# Patient Record
Sex: Female | Born: 1980 | Race: White | Hispanic: No | Marital: Single | Smoking: Current every day smoker
Health system: Southern US, Community
[De-identification: ages and names within clinical notes are randomized; demographics above are authoritative.]

## PROBLEM LIST (undated history)

## (undated) HISTORY — PX: APPENDECTOMY: SHX54

---
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
## 2003-05-19 ENCOUNTER — Emergency Department (HOSPITAL_COMMUNITY): Admission: EM | Admit: 2003-05-19 | Discharge: 2003-05-19 | Payer: Self-pay | Admitting: Emergency Medicine

## 2004-01-19 ENCOUNTER — Emergency Department (HOSPITAL_COMMUNITY): Admission: EM | Admit: 2004-01-19 | Discharge: 2004-01-19 | Payer: Self-pay | Admitting: Emergency Medicine

## 2004-10-01 ENCOUNTER — Emergency Department (HOSPITAL_COMMUNITY): Admission: EM | Admit: 2004-10-01 | Discharge: 2004-10-01 | Payer: Self-pay | Admitting: Emergency Medicine

## 2004-12-27 ENCOUNTER — Emergency Department (HOSPITAL_COMMUNITY): Admission: EM | Admit: 2004-12-27 | Discharge: 2004-12-27 | Payer: Self-pay | Admitting: Emergency Medicine

## 2005-01-22 ENCOUNTER — Emergency Department (HOSPITAL_COMMUNITY): Admission: EM | Admit: 2005-01-22 | Discharge: 2005-01-22 | Payer: Self-pay | Admitting: Emergency Medicine

## 2005-04-22 ENCOUNTER — Emergency Department (HOSPITAL_COMMUNITY): Admission: EM | Admit: 2005-04-22 | Discharge: 2005-04-22 | Payer: Self-pay | Admitting: Emergency Medicine

## 2005-11-13 ENCOUNTER — Emergency Department (HOSPITAL_COMMUNITY): Admission: EM | Admit: 2005-11-13 | Discharge: 2005-11-13 | Payer: Self-pay | Admitting: Emergency Medicine

## 2007-09-23 ENCOUNTER — Encounter (INDEPENDENT_AMBULATORY_CARE_PROVIDER_SITE_OTHER): Payer: Self-pay | Admitting: Unknown Physician Specialty

## 2007-09-23 ENCOUNTER — Other Ambulatory Visit: Admission: RE | Admit: 2007-09-23 | Discharge: 2007-09-23 | Payer: Self-pay | Admitting: Unknown Physician Specialty

## 2007-11-15 ENCOUNTER — Emergency Department (HOSPITAL_COMMUNITY): Admission: EM | Admit: 2007-11-15 | Discharge: 2007-11-15 | Payer: Self-pay | Admitting: Emergency Medicine

## 2007-12-08 ENCOUNTER — Emergency Department (HOSPITAL_COMMUNITY): Admission: EM | Admit: 2007-12-08 | Discharge: 2007-12-08 | Payer: Self-pay | Admitting: Emergency Medicine

## 2008-06-08 ENCOUNTER — Emergency Department (HOSPITAL_COMMUNITY): Admission: EM | Admit: 2008-06-08 | Discharge: 2008-06-08 | Payer: Self-pay | Admitting: Emergency Medicine

## 2009-04-20 ENCOUNTER — Emergency Department (HOSPITAL_COMMUNITY): Admission: EM | Admit: 2009-04-20 | Discharge: 2009-04-20 | Payer: Self-pay | Admitting: Emergency Medicine

## 2009-06-09 ENCOUNTER — Emergency Department (HOSPITAL_COMMUNITY): Admission: EM | Admit: 2009-06-09 | Discharge: 2009-06-09 | Payer: Self-pay | Admitting: Emergency Medicine

## 2009-10-16 ENCOUNTER — Emergency Department (HOSPITAL_COMMUNITY): Admission: EM | Admit: 2009-10-16 | Discharge: 2009-10-16 | Payer: Self-pay | Admitting: Emergency Medicine

## 2010-02-28 ENCOUNTER — Emergency Department (HOSPITAL_COMMUNITY): Admission: EM | Admit: 2010-02-28 | Discharge: 2010-02-28 | Payer: Self-pay | Admitting: Emergency Medicine

## 2010-03-27 ENCOUNTER — Emergency Department (HOSPITAL_COMMUNITY): Admission: EM | Admit: 2010-03-27 | Discharge: 2010-03-27 | Payer: Self-pay | Admitting: Emergency Medicine

## 2010-06-15 ENCOUNTER — Emergency Department (HOSPITAL_COMMUNITY): Admission: EM | Admit: 2010-06-15 | Discharge: 2010-06-15 | Payer: Self-pay | Admitting: Emergency Medicine

## 2010-11-05 LAB — CBC
MCH: 31.7 pg (ref 26.0–34.0)
WBC: 8.7 10*3/uL (ref 4.0–10.5)

## 2010-11-05 LAB — HEPATIC FUNCTION PANEL: Total Protein: 7.1 g/dL (ref 6.0–8.3)

## 2010-11-05 LAB — DIFFERENTIAL
Basophils Absolute: 0.1 10*3/uL (ref 0.0–0.1)
Basophils Relative: 1 % (ref 0–1)
Eosinophils Absolute: 0.1 10*3/uL (ref 0.0–0.7)
Monocytes Relative: 9 % (ref 3–12)
Neutro Abs: 4.9 10*3/uL (ref 1.7–7.7)

## 2010-11-24 LAB — URINALYSIS, ROUTINE W REFLEX MICROSCOPIC
Hgb urine dipstick: NEGATIVE
Nitrite: NEGATIVE

## 2011-03-27 ENCOUNTER — Emergency Department (HOSPITAL_COMMUNITY)
Admission: EM | Admit: 2011-03-27 | Discharge: 2011-03-28 | Disposition: A | Discharge status: Home / Self Care | Payer: Self-pay | Attending: Emergency Medicine | Admitting: Emergency Medicine

## 2011-03-27 ENCOUNTER — Encounter: Payer: Self-pay | Admitting: *Deleted

## 2011-03-27 DIAGNOSIS — N898 Other specified noninflammatory disorders of vagina: Secondary | ICD-10-CM | POA: Insufficient documentation

## 2011-03-27 DIAGNOSIS — N939 Abnormal uterine and vaginal bleeding, unspecified: Secondary | ICD-10-CM

## 2011-03-27 DIAGNOSIS — F319 Bipolar disorder, unspecified: Secondary | ICD-10-CM | POA: Insufficient documentation

## 2011-03-27 DIAGNOSIS — F17200 Nicotine dependence, unspecified, uncomplicated: Secondary | ICD-10-CM | POA: Insufficient documentation

## 2011-03-27 DIAGNOSIS — N946 Dysmenorrhea, unspecified: Secondary | ICD-10-CM | POA: Insufficient documentation

## 2011-03-27 LAB — PREGNANCY, URINE: Preg Test, Ur: NEGATIVE

## 2011-03-27 LAB — POCT PREGNANCY, URINE: Preg Test, Ur: NEGATIVE

## 2011-03-27 MED ORDER — ACETAMINOPHEN 325 MG PO TABS
650.0000 mg | ORAL_TABLET | Freq: Once | ORAL | Status: AC
  Administered 2011-03-27: 650 mg via ORAL
  Filled 2011-03-27: qty 2

## 2011-03-27 NOTE — ED Notes (Signed)
Patient with sudden vaginal bleeding, patient unsure if pregnant but admits to being a month and a half late, vaginal bleeding has slowed down a lot since but still spotting; black blood clots noted and bright red in color, c/o lower abd pain

## 2011-03-27 NOTE — ED Provider Notes (Signed)
History     CSN: [id number] Arrival date & time: 03/27/2011 10:53 PM  Chief Complaint  Patient presents with  . Vaginal Bleeding   HPI Comments: Patient is a 30 year old female who has no significant past medical history other than bipolar disorder who presents with acute onset vaginal bleeding this evening. She states that initially it was painless but then developed diffuse abdominal cramping within 30 minutes. The cramping is intermittent, gradually improving, not associated with fever, chills, nausea, vomiting. She states that she is trying to get pregnant at this time and is having sexual intercourse without protection to this end. She has never been pregnant in the past. The vaginal bleeding was bright red mixed with dark red blood. She denies urinary symptoms, bowel habit change, swelling, rash, itching  Patient is a 30 y.o. female presenting with vaginal bleeding. The history is provided by the patient.  Vaginal Bleeding    History reviewed. No pertinent past medical history.  Past Surgical History  Procedure Date  . Appendectomy     History reviewed. No pertinent family history.  History  Substance Use Topics  . Smoking status: Current Everyday Smoker -- 1.0 packs/day    Types: Cigarettes  . Smokeless tobacco: Not on file  . Alcohol Use: No    OB History    Grav Para Term Preterm Abortions TAB SAB Ect Mult Living                  Review of Systems  Genitourinary: Positive for vaginal bleeding.  All other systems reviewed and are negative.    Physical Exam  BP 113/69  Pulse 92  Temp(Src) 98.2 F (36.8 C) (Oral)  Resp 18  Ht 5\' 6"  (1.676 m)  Wt 110 lb (49.896 kg)  BMI 17.75 kg/m2  SpO2 100%  LMP 01/31/2011  Physical Exam  Nursing note and vitals reviewed. Constitutional: She appears well-developed and well-nourished. No distress.  HENT:  Head: Normocephalic and atraumatic.  Mouth/Throat: Oropharynx is clear and moist. No oropharyngeal exudate.    Eyes: Conjunctivae and EOM are normal. Pupils are equal, round, and reactive to light. Right eye exhibits no discharge. Left eye exhibits no discharge. No scleral icterus.  Neck: Normal range of motion. Neck supple. No JVD present. No thyromegaly present.  Cardiovascular: Normal rate, regular rhythm, normal heart sounds and intact distal pulses.  Exam reveals no gallop and no friction rub.   No murmur heard. Pulmonary/Chest: Effort normal and breath sounds normal. No respiratory distress. She has no wheezes. She has no rales.  Abdominal: Soft. Bowel sounds are normal. She exhibits no distension and no mass. There is tenderness (Mild diffuse tenderness to palpation, no focal tenderness, normal bowel sounds, non-peritoneal, no guarding. No CVA tenderness.).  Genitourinary:       Chaperone present during exam, vaginal bleeding, no cervical motion tenderness, adnexal tenderness or masses.  Musculoskeletal: Normal range of motion. She exhibits no edema and no tenderness.  Lymphadenopathy:    She has no cervical adenopathy.  Neurological: She is alert. Coordination normal.  Skin: Skin is warm and dry. No rash noted. No erythema.  Psychiatric: She has a normal mood and affect. Her behavior is normal.    ED Course  Procedures  MDM Evaluate for pregnancy, possible miscarriage. Has normal blood pressure, pulse, respirations, oxygen saturations. She is afebrile.  Pelvic exam benign, urinalysis negative, urine pregnancy negative. Likely having menstrual cramps and pains. NSAIDs provided, reassurance given.  Vida Roller, MD 03/28/11 [phone]

## 2011-03-28 LAB — URINE MICROSCOPIC-ADD ON

## 2011-03-28 LAB — URINALYSIS, ROUTINE W REFLEX MICROSCOPIC
Glucose, UA: NEGATIVE mg/dL
Ketones, ur: NEGATIVE mg/dL
Leukocytes, UA: NEGATIVE
Nitrite: NEGATIVE
Specific Gravity, Urine: >1.03 — ABNORMAL HIGH (ref 1.005–1.030)
pH: 6 (ref 5.0–8.0)

## 2011-03-28 LAB — WET PREP, GENITAL: Yeast Wet Prep HPF POC: NONE SEEN

## 2011-03-28 MED ORDER — IBUPROFEN 800 MG PO TABS: 800.0000 mg | ORAL_TABLET | Freq: Once | ORAL | Status: DC

## 2011-03-28 MED ORDER — IBUPROFEN 800 MG PO TABS: 800.0000 mg | ORAL_TABLET | Freq: Three times a day (TID) | ORAL | Status: AC

## 2011-03-29 LAB — GC/CHLAMYDIA PROBE AMP, GENITAL: Chlamydia, DNA Probe: NEGATIVE

## 2011-05-14 LAB — URINE CULTURE

## 2011-05-14 LAB — URINALYSIS, ROUTINE W REFLEX MICROSCOPIC
Glucose, UA: NEGATIVE
Hgb urine dipstick: NEGATIVE

## 2011-05-14 LAB — PREGNANCY, URINE: Preg Test, Ur: NEGATIVE

## 2017-01-09 HISTORY — PX: KNEE SURGERY: SHX244

## 2019-11-10 ENCOUNTER — Encounter: Payer: Self-pay | Admitting: Cardiovascular Disease

## 2019-11-11 ENCOUNTER — Emergency Department (HOSPITAL_COMMUNITY)
Admission: EM | Admit: 2019-11-11 | Discharge: 2019-11-11 | Disposition: A | Discharge status: Home / Self Care | Payer: Medicaid Other

## 2019-11-11 ENCOUNTER — Other Ambulatory Visit: Payer: Self-pay

## 2019-11-12 ENCOUNTER — Emergency Department (HOSPITAL_COMMUNITY)
Admission: EM | Admit: 2019-11-12 | Discharge: 2019-11-12 | Disposition: A | Discharge status: Home / Self Care | Payer: Medicaid Other | Attending: Emergency Medicine | Admitting: Emergency Medicine

## 2019-11-12 ENCOUNTER — Encounter (HOSPITAL_COMMUNITY): Payer: Self-pay

## 2019-11-12 ENCOUNTER — Emergency Department (HOSPITAL_COMMUNITY): Payer: Medicaid Other

## 2019-11-12 DIAGNOSIS — I951 Orthostatic hypotension: Secondary | ICD-10-CM | POA: Diagnosis not present

## 2019-11-12 DIAGNOSIS — F17210 Nicotine dependence, cigarettes, uncomplicated: Secondary | ICD-10-CM | POA: Diagnosis not present

## 2019-11-12 DIAGNOSIS — Z79899 Other long term (current) drug therapy: Secondary | ICD-10-CM | POA: Insufficient documentation

## 2019-11-12 LAB — CBC WITH DIFFERENTIAL/PLATELET
Abs Immature Granulocytes: 0.02 10*3/uL (ref 0.00–0.07)
Basophils Absolute: 0 10*3/uL (ref 0.0–0.1)
Basophils Relative: 0 %
Eosinophils Absolute: 0 10*3/uL (ref 0.0–0.5)
Eosinophils Relative: 0 %
HCT: 41.9 % (ref 36.0–46.0)
Hemoglobin: 13.5 g/dL (ref 12.0–15.0)
Immature Granulocytes: 0 %
Lymphocytes Relative: 24 %
Lymphs Abs: 2.2 10*3/uL (ref 0.7–4.0)
MCH: 30.6 pg (ref 26.0–34.0)
MCHC: 32.2 g/dL (ref 30.0–36.0)
MCV: 95 fL (ref 80.0–100.0)
Monocytes Absolute: 0.4 10*3/uL (ref 0.1–1.0)
Monocytes Relative: 4 %
Neutro Abs: 6.4 10*3/uL (ref 1.7–7.7)
Neutrophils Relative %: 72 %
Platelets: 281 10*3/uL (ref 150–400)
RBC: 4.41 MIL/uL (ref 3.87–5.11)
RDW: 12.4 % (ref 11.5–15.5)
WBC: 9 10*3/uL (ref 4.0–10.5)
nRBC: 0 % (ref 0.0–0.2)

## 2019-11-12 LAB — BASIC METABOLIC PANEL
Anion gap: 8 (ref 5–15)
BUN: 9 mg/dL (ref 6–20)
CO2: 24 mmol/L (ref 22–32)
Calcium: 8.8 mg/dL — ABNORMAL LOW (ref 8.9–10.3)
Chloride: 108 mmol/L (ref 98–111)
Creatinine, Ser: 0.65 mg/dL (ref 0.44–1.00)
GFR calc Af Amer: >60 mL/min (ref >60)
GFR calc non Af Amer: >60 mL/min (ref >60)
Glucose, Bld: 85 mg/dL (ref 70–99)
Potassium: 4.1 mmol/L (ref 3.5–5.1)
Sodium: 140 mmol/L (ref 135–145)

## 2019-11-12 LAB — I-STAT BETA HCG BLOOD, ED (MC, WL, AP ONLY): I-stat hCG, quantitative: <5 m[IU]/mL (ref <5)

## 2019-11-12 MED ORDER — SODIUM CHLORIDE 0.9 % IV BOLUS
1000.0000 mL | Freq: Once | INTRAVENOUS | Status: AC
  Administered 2019-11-12: 1000 mL via INTRAVENOUS

## 2019-11-12 NOTE — Discharge Instructions (Signed)
Please make sure you are staying well-hydrated at home.  You should keep your appointment with cardiology tomorrow.  Return to the emergency department for new or worsening symptoms.

## 2019-11-12 NOTE — ED Notes (Signed)
Patient transported to x-ray. ?

## 2019-11-12 NOTE — ED Provider Notes (Signed)
[facility] EMERGENCY DEPARTMENT Provider Note   CSN: [id number] Arrival date & time: 11/12/19  6962     History Chief Complaint  Patient presents with  . Blood Pressure Issues    Michele Church is a 39 y.o. female.  HPI   Patient is a 39 year old female who presents the emergency department today complaining of issues with her blood pressure.  States she has had orthostatic hypotension for about the last week.  States her blood pressure drops when she stands up and she feels lightheaded.  She has some associated shortness of breath has been ongoing for the same time period.  She denies any associated chest pain or palpitations.  She denies any bilateral lower extremity pain or swelling.  She denies any estrogen therapy.  No recent admissions or surgeries.  No recent trauma.  No history of VTE or cancer.  She denies excessive caffeine intake.  She denies any illicit drug use or over-the-counter supplements.  She has not had any recent medication changes.  She has followed up about this at an outside facility and states she had labs drawn 2 days ago that she states were normal.  She was referred to cardiology and has an appointment tomorrow.  She decided to come to the ED today because she was so symptomatic from her blood pressure.  She is on buspar, celexa and trazodone.  History reviewed. No pertinent past medical history.  There are no problems to display for this patient.   Past Surgical History:  Procedure Laterality Date  . APPENDECTOMY       OB History   No obstetric history on file.     No family history on file.  Social History   Tobacco Use  . Smoking status: Current Every Day Smoker    Packs/day: 0.50    Types: Cigarettes  . Smokeless tobacco: Never Used  Substance Use Topics  . Alcohol use: No  . Drug use: No    Home Medications Prior to Admission medications   Medication Sig Start Date End Date Taking? Authorizing Provider  celecoxib (CELEBREX) 100 MG  capsule Take 100 mg by mouth daily.      [provider]  lamoTRIgine (LAMICTAL) 100 MG tablet Take 100 mg by mouth daily.      [provider]  methylphenidate (RITALIN) 10 MG tablet Take 10 mg by mouth 2 (two) times daily.      [provider]  mirtazapine (REMERON) 15 MG tablet Take 15 mg by mouth at bedtime.      [provider]    Allergies    Patient has no known allergies.  Review of Systems   Review of Systems  Constitutional: Negative for chills and fever.  HENT: Negative for ear pain and sore throat.   Eyes: Negative for visual disturbance.  Respiratory: Positive for shortness of breath. Negative for cough.   Cardiovascular: Negative for chest pain and leg swelling.  Gastrointestinal: Negative for abdominal pain, constipation, diarrhea, nausea and vomiting.  Genitourinary: Negative for dysuria and hematuria.  Musculoskeletal: Negative for back pain.  Skin: Negative for rash.  Neurological: Positive for light-headedness.  All other systems reviewed and are negative.   Physical Exam Updated Vital Signs BP 129/79   Pulse 73   Temp 98.4 F (36.9 C) (Oral)   Resp 20   Ht 5\' 7"  (1.702 m)   Wt 45.4 kg   LMP 11/07/2019   SpO2 100%   BMI 15.66 kg/m   Physical  Exam Vitals and nursing note reviewed.  Constitutional:      General: She is not in acute distress.    Appearance: She is well-developed. She is not ill-appearing or toxic-appearing.  HENT:     Head: Normocephalic and atraumatic.  Eyes:     Conjunctiva/sclera: Conjunctivae normal.  Cardiovascular:     Rate and Rhythm: Normal rate and regular rhythm.     Pulses: Normal pulses.     Heart sounds: Normal heart sounds. No murmur.  Pulmonary:     Effort: Pulmonary effort is normal. No respiratory distress.     Breath sounds: Normal breath sounds. No wheezing, rhonchi or rales.  Abdominal:     General: Bowel sounds are normal.     Palpations: Abdomen is soft.     Tenderness:  There is no abdominal tenderness. There is no guarding or rebound.  Musculoskeletal:     Cervical back: Neck supple.  Skin:    General: Skin is warm and dry.  Neurological:     Mental Status: She is alert.     ED Results / Procedures / Treatments   Labs (all labs ordered are listed, but only abnormal results are displayed) Labs Reviewed  BASIC METABOLIC PANEL - Abnormal; Notable for the following components:      Result Value   Calcium 8.8 (*)    All other components within normal limits  CBC WITH DIFFERENTIAL/PLATELET  I-STAT BETA HCG BLOOD, ED ([facility], [facility], [facility] ONLY)  I-STAT BETA HCG BLOOD, ED ([facility], [facility], [facility] ONLY)    EKG EKG Interpretation  Date/Time:  Thursday November 12 2019 08:37:56 EDT Ventricular Rate:  74 PR Interval:    QRS Duration: 72 QT Interval:  398 QTC Calculation: 442 R Axis:   76 Text Interpretation: Sinus rhythm RSR' in V1 or V2, probably normal variant Confirmed by Elnora Morrison [phone]) on 11/12/2019 9:14:17 AM   Radiology DG Chest Portable 1 View  Result Date: 11/12/2019 CLINICAL DATA:  Shortness of breath EXAM: PORTABLE CHEST 1 VIEW COMPARISON:  11/02/2019 FINDINGS: Cardiomediastinal contours and hilar structures are unremarkable. Lungs are clear. No acute bone process. IMPRESSION: No acute cardiopulmonary disease. Electronically Signed   By: Zetta Bills M.D.   On: 11/12/2019 09:09    Procedures Procedures (including critical care time)  9:05 AM Cardiac monitoring reveals NSR, HR 70s (Rate & rhythm), as reviewed and interpreted by me. Cardiac monitoring was ordered due to orthostatic hypotension, sob, and to monitor patient for dysrhythmia.   Medications Ordered in ED Medications  sodium chloride 0.9 % bolus 1,000 mL (0 mLs Intravenous Stopped 11/12/19 1045)    ED Course  I have reviewed the triage vital signs and the nursing notes.  Pertinent labs & imaging results that were available during my care of the patient were reviewed by me and considered  in my medical decision making (see chart for details).    MDM Rules/Calculators/A&P                      Patient is a 39 year old female presenting for evaluation of orthostatic hypotension.  Has been ongoing intermittently for the last week.  Has had work-up at outside facility showing normal labs.  Has been referred to cardiology and has follow-up planned for tomorrow.  In the ED, patient is mildly orthostatic.  Her vital signs are otherwise reassuring.  Reviewed/interpreted labs CBC w/o anemia BMP without gross electrolyte derangement.  Normal kidney function. Pregnancy test neg  EKG with NSR, RSR' in  V1 or V2, probably normal variant  Chest x-ray personally reviewed/interpreted -no pneumonia, pneumothorax or other acute abnormality.  Reviewed pts meds per care everywhere. She is on buspar, celexa and trazodone. Celexa can cause orthostatic hypotension. Trazodone can also cause hypotension.  This may need to be considered when she follows up with cardiology.  Reassessed patient.  She is feeling improved after IV fluids.  Discussed results of the lab work, EKG and chest x-ray and discussed plan for follow-up with cardiology.  I did not find any emergent etiology of her symptoms that would require further work-up or admission to the hospital at this time.  Dorene Sorrow has appropriate follow-up scheduled.  Advised on specific return precautions.  She voiced understanding plan reasons to return.  Questions answered.  Patient stable for discharge.  Final Clinical Impression(s) / ED Diagnoses Final diagnoses:  Orthostatic hypotension    Rx / DC Orders ED Discharge Orders    None       Rayne Du 11/12/19 1150    Blane Ohara, MD 11/14/19 [phone]

## 2019-11-12 NOTE — ED Triage Notes (Signed)
BP 121/82 lying HR 69  BP 126/83 sitting HR 71 BP 111/85 standing HR 99  Pt has been having orthostatic hypotension issues. Has seen [facility] for this as well as PCP. Pt has an appointment to follow up with cardiology.

## 2019-11-30 ENCOUNTER — Encounter: Payer: Self-pay | Admitting: *Deleted

## 2019-12-01 ENCOUNTER — Ambulatory Visit: Payer: Medicaid Other | Admitting: Cardiovascular Disease

## 2019-12-02 ENCOUNTER — Encounter: Payer: Self-pay | Admitting: Cardiovascular Disease

## 2019-12-14 ENCOUNTER — Encounter: Payer: Self-pay | Admitting: *Deleted

## 2019-12-15 ENCOUNTER — Other Ambulatory Visit: Payer: Self-pay

## 2019-12-15 ENCOUNTER — Ambulatory Visit (INDEPENDENT_AMBULATORY_CARE_PROVIDER_SITE_OTHER): Payer: Medicaid Other | Admitting: Cardiovascular Disease

## 2019-12-15 ENCOUNTER — Encounter: Payer: Self-pay | Admitting: Cardiovascular Disease

## 2019-12-15 VITALS — BP 110/70 | HR 95 | Ht 67.0 in | Wt 92.0 lb

## 2019-12-15 DIAGNOSIS — I951 Orthostatic hypotension: Secondary | ICD-10-CM

## 2019-12-15 NOTE — Progress Notes (Signed)
CARDIOLOGY CONSULT NOTE  Patient ID: Michele Church MRN: [id number] DOB/AGE: 1981/02/11 39 y.o.  Admit date: (Not on file) Primary Physician: [facility]    Reason for Consultation: Orthostatic hypotension  HPI: Michele Church is a 39 y.o. female who is being seen today for the evaluation of orthostatic hypotension at the request of Allwardt, Alyssa, PA.   She was evaluated in the ED most recently on 11/12/2019 for orthostatic hypotension.  She denied associated chest pain or palpitations.  She had no bilateral leg swelling.  She smokes about 1/2 pack of cigarettes daily.  BP was 09/17/1977 at rest with a heart rate of 73 bpm.  She was given IV fluids and symptoms improved.  Basic metabolic panel and CBC were unremarkable.  Chest x-ray showed no acute cardiopulmonary disease.  I personally reviewed the ECG which demonstrated sinus rhythm with RSR prime pattern.  She told me she has been experiencing orthostatic dizziness for the past 2 months.  She denies syncope.  She denies exertional chest pain or shortness of breath.  She has bilateral knee problems and previously had a procedure on the right knee and may need a procedure on the left knee.  She denies leg swelling but has had some left knee swelling.  She drinks 2-3 bottles of soda a day and possibly 2-3 bottles of water daily.  She drinks Pedialyte on occasion and a protein shake on occasion.  She said she has not been eating well due to increased levels of stress at home.  She has a 33-year-old daughter who weighs 100 pounds and is projected to be 6 foot 4.   No Known Allergies  Current Outpatient Medications  Medication Sig Dispense Refill  . celecoxib (CELEBREX) 100 MG capsule Take 100 mg by mouth daily.      . citalopram (CELEXA) 10 MG tablet Take 10 mg by mouth daily.    . traZODone (DESYREL) 100 MG tablet Take 100 mg by mouth at bedtime.     No current facility-administered medications for this  visit.    History reviewed. No pertinent past medical history.  Past Surgical History:  Procedure Laterality Date  . APPENDECTOMY    . KNEE SURGERY Right 01/09/2017   right knee release of knee cap    Social History   Socioeconomic History  . Marital status: Single    Spouse name: Not on file  . Number of children: Not on file  . Years of education: Not on file  . Highest education level: Not on file  Occupational History  . Not on file  Tobacco Use  . Smoking status: Current Every Day Smoker    Packs/day: 0.50    Types: Cigarettes  . Smokeless tobacco: Never Used  Substance and Sexual Activity  . Alcohol use: No  . Drug use: No  . Sexual activity: Yes  Other Topics Concern  . Not on file  Social History Narrative  . Not on file   Social Determinants of Health   Financial Resource Strain:   . Difficulty of Paying Living Expenses:   Food Insecurity:   . Worried About Programme researcher, broadcasting/film/video in the Last Year:   . Barista in the Last Year:   Transportation Needs:   . Freight forwarder (Medical):   Marland Kitchen Lack of Transportation (Non-Medical):   Physical Activity:   . Days of Exercise per Week:   . Minutes of Exercise per Session:  Stress:   . Feeling of Stress :   Social Connections:   . Frequency of Communication with Friends and Family:   . Frequency of Social Gatherings with Friends and Family:   . Attends Religious Services:   . Active Member of Clubs or Organizations:   . Attends Archivist Meetings:   Marland Kitchen Marital Status:   Intimate Partner Violence:   . Fear of Current or Ex-Partner:   . Emotionally Abused:   Marland Kitchen Physically Abused:   . Sexually Abused:      No family history of premature CAD in 1st degree relatives.  Current Meds  Medication Sig  . celecoxib (CELEBREX) 100 MG capsule Take 100 mg by mouth daily.    . citalopram (CELEXA) 10 MG tablet Take 10 mg by mouth daily.  . traZODone (DESYREL) 100 MG tablet Take 100 mg by mouth  at bedtime.  . [DISCONTINUED] lamoTRIgine (LAMICTAL) 100 MG tablet Take 100 mg by mouth daily.        Review of systems complete and found to be negative unless listed above in HPI   [facility], LPN was present throughout the entirety of the encounter.  Physical exam Blood pressure 110/70, pulse 95, height 5\' 7"  (1.702 m), weight 92 lb (41.7 kg). General: NAD Neck: No JVD, no thyromegaly or thyroid nodule.  Lungs: Clear to auscultation bilaterally with normal respiratory effort. CV: Nondisplaced PMI. Regular rate and rhythm, normal S1/S2, no S3/S4, no murmur.  No peripheral edema.  No carotid bruit.    Abdomen: Soft, nontender, no distention.  Skin: Intact without lesions or rashes.  Neurologic: Alert and oriented x 3.  Psych: Normal affect. Extremities: No clubbing or cyanosis.  HEENT: Normal.   ECG: Most recent ECG reviewed.   Labs: Lab Results  Component Value Date/Time   K 4.1 11/12/2019 09:06 AM   BUN 9 11/12/2019 09:06 AM   CREATININE 0.65 11/12/2019 09:06 AM   ALT 10 02/28/2010 05:03 PM   HGB 13.5 11/12/2019 09:06 AM     Lipids: No results found for: LDLCALC, LDLDIRECT, CHOL, TRIG, HDL      ASSESSMENT AND PLAN:   1.  Orthostatic hypotension: Orthostatic vitals were checked today and blood pressure increased from 110/70 lying down to 122/82 with standing and 118/82 after standing for 3 minutes.  Heart rates ranged from 95-104 bpm.  There was no significant orthostatic hypotension.  I spoke to her about conservative strategies to help improve symptoms including increasing water intake and decreasing soda consumption.  I also encouraged her to eat a more balanced diet with increased levels of protein which will increase oncotic pressure and thus improve blood pressures and reduce symptoms.    Disposition: Follow up as needed  Signed: Kate Sable, M.D., F.A.C.C.  12/15/2019, 1:36 PM

## 2019-12-15 NOTE — Patient Instructions (Addendum)
Medication Instructions:  Continue all current medications.  Labwork: none  Testing/Procedures: none  Follow-Up: As needed.    Any Other Special Instructions Will Be Listed Below (If Applicable).  If you need a refill on your cardiac medications before your next appointment, please call your pharmacy.  

## 2021-08-22 IMAGING — DX DG CHEST 1V PORT
1 series · 1 of 1 positions shown · non-contrast
Comparison: 11/02/2019

CLINICAL DATA: Shortness of breath

EXAM:
PORTABLE CHEST 1 VIEW

[chest ap]
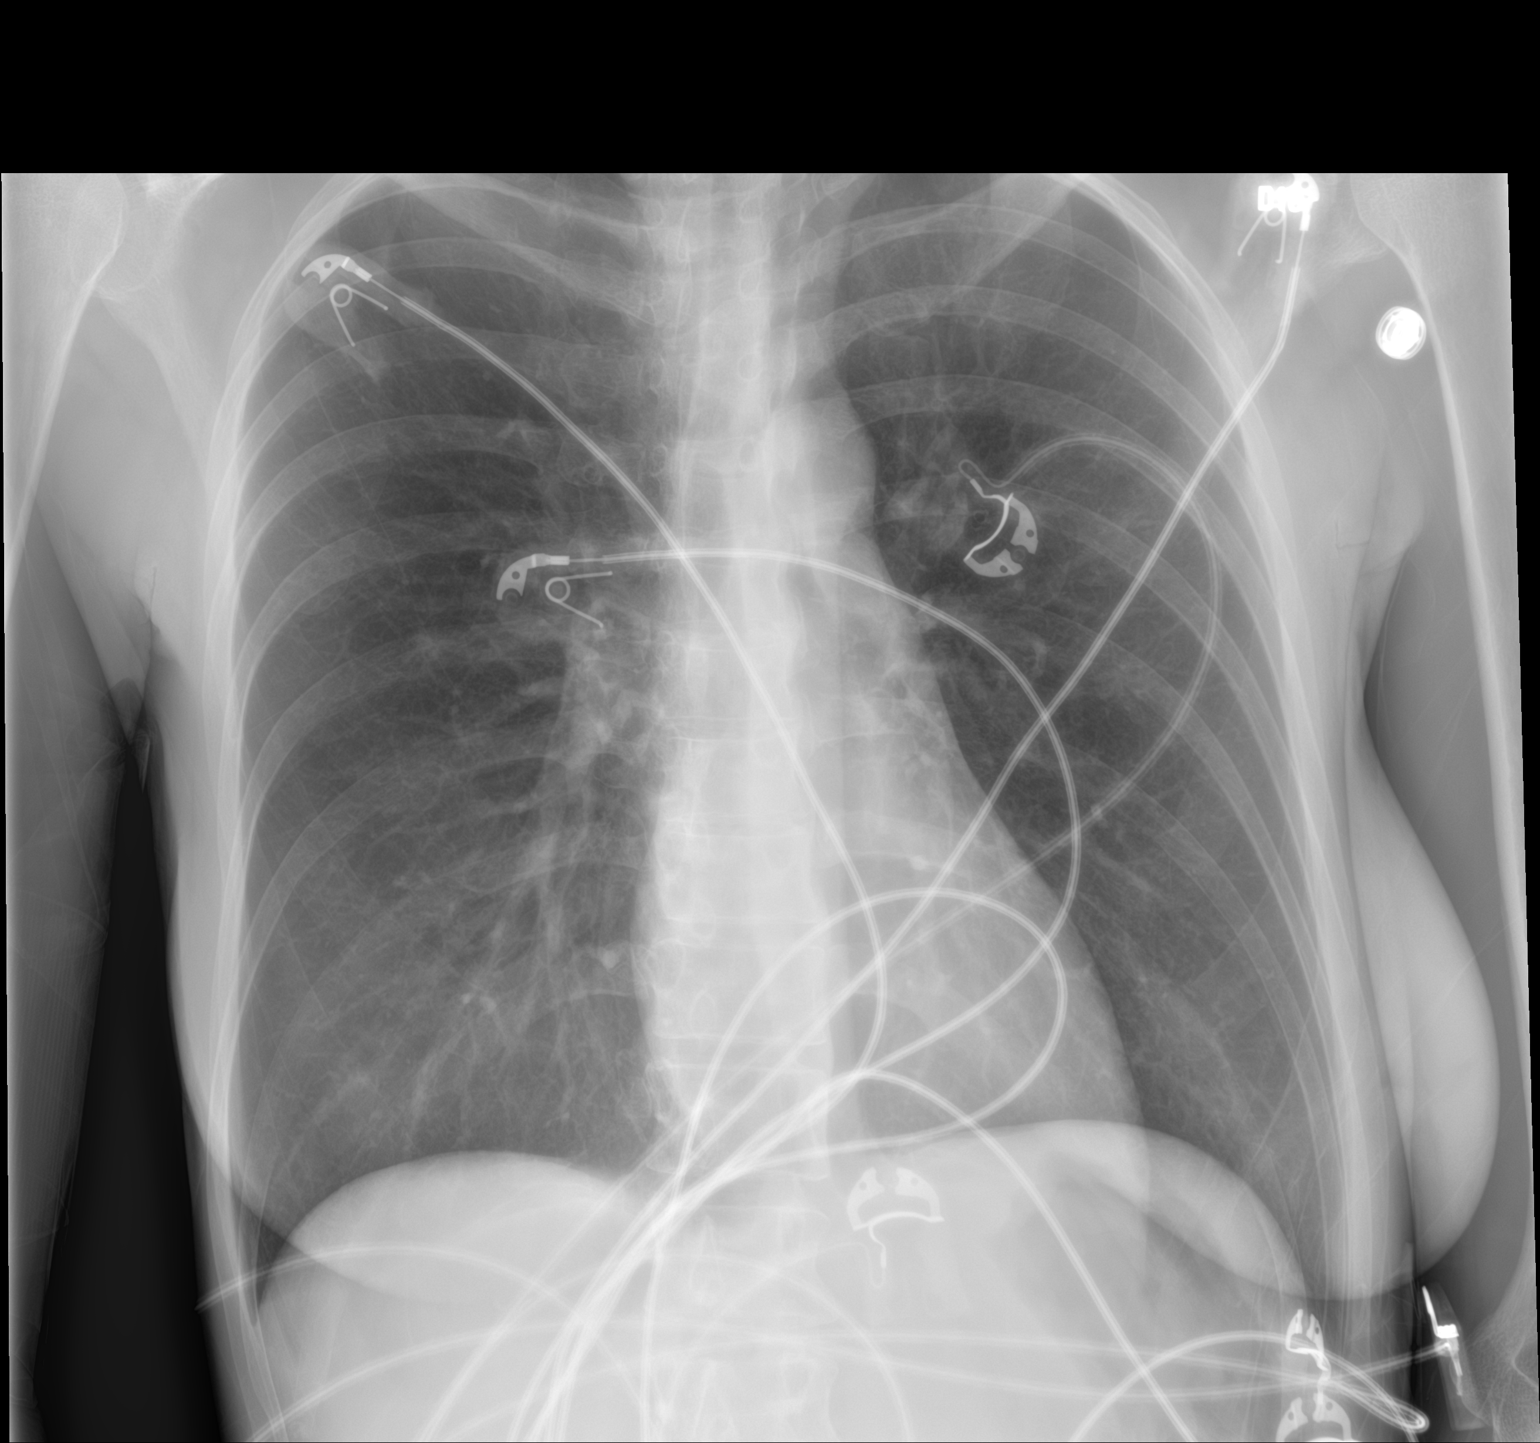

[1 of 1 positions shown; findings below may reference images not displayed]

FINDINGS: Cardiomediastinal contours and hilar structures are unremarkable.
Lungs are clear.

No acute bone process.
IMPRESSION: No acute cardiopulmonary disease.
